# Patient Record
Sex: Female | Born: 1950 | Race: Asian | Hispanic: No | Marital: Married
Health system: Southern US, Community
[De-identification: ages and names within clinical notes are randomized; demographics above are authoritative.]

## PROBLEM LIST (undated history)

## (undated) DIAGNOSIS — E119 Type 2 diabetes mellitus without complications: Secondary | ICD-10-CM

## (undated) DIAGNOSIS — I10 Essential (primary) hypertension: Secondary | ICD-10-CM

## (undated) HISTORY — DX: Essential (primary) hypertension: I10

## (undated) HISTORY — DX: Type 2 diabetes mellitus without complications: E11.9

---

## 1998-11-15 HISTORY — PX: ABDOMINAL HYSTERECTOMY: SHX81

## 2007-06-16 ENCOUNTER — Other Ambulatory Visit: Admission: RE | Admit: 2007-06-16 | Discharge: 2007-06-16 | Payer: Self-pay | Admitting: Family Medicine

## 2008-07-09 ENCOUNTER — Ambulatory Visit: Payer: Self-pay | Admitting: Internal Medicine

## 2008-07-09 DIAGNOSIS — I10 Essential (primary) hypertension: Secondary | ICD-10-CM | POA: Insufficient documentation

## 2008-07-09 DIAGNOSIS — R1011 Right upper quadrant pain: Secondary | ICD-10-CM | POA: Insufficient documentation

## 2008-07-11 ENCOUNTER — Encounter: Admission: RE | Admit: 2008-07-11 | Discharge: 2008-07-11 | Payer: Self-pay | Admitting: Internal Medicine

## 2008-07-11 ENCOUNTER — Telehealth: Payer: Self-pay | Admitting: Internal Medicine

## 2010-04-14 ENCOUNTER — Other Ambulatory Visit: Admission: RE | Admit: 2010-04-14 | Discharge: 2010-04-14 | Payer: Self-pay | Admitting: Family Medicine

## 2010-10-12 ENCOUNTER — Encounter: Admission: RE | Admit: 2010-10-12 | Discharge: 2010-10-12 | Payer: Self-pay | Admitting: Family Medicine

## 2014-03-13 ENCOUNTER — Other Ambulatory Visit: Payer: Self-pay | Admitting: Family Medicine

## 2014-03-13 DIAGNOSIS — Z800 Family history of malignant neoplasm of digestive organs: Secondary | ICD-10-CM

## 2014-03-13 DIAGNOSIS — R1011 Right upper quadrant pain: Secondary | ICD-10-CM

## 2014-03-18 ENCOUNTER — Ambulatory Visit
Admission: RE | Admit: 2014-03-18 | Discharge: 2014-03-18 | Disposition: A | Discharge status: Home / Self Care | Payer: Managed Care, Other (non HMO) | Source: Ambulatory Visit | Attending: Family Medicine | Admitting: Family Medicine

## 2014-03-18 DIAGNOSIS — R1011 Right upper quadrant pain: Secondary | ICD-10-CM

## 2014-03-18 DIAGNOSIS — Z800 Family history of malignant neoplasm of digestive organs: Secondary | ICD-10-CM

## 2015-01-15 ENCOUNTER — Other Ambulatory Visit: Payer: Self-pay | Admitting: Gastroenterology

## 2015-01-15 DIAGNOSIS — R1310 Dysphagia, unspecified: Secondary | ICD-10-CM

## 2015-01-17 ENCOUNTER — Ambulatory Visit
Admission: RE | Admit: 2015-01-17 | Discharge: 2015-01-17 | Disposition: A | Discharge status: Home / Self Care | Payer: Managed Care, Other (non HMO) | Source: Ambulatory Visit | Attending: Gastroenterology | Admitting: Gastroenterology

## 2015-01-17 DIAGNOSIS — R1310 Dysphagia, unspecified: Secondary | ICD-10-CM

## 2015-04-01 ENCOUNTER — Other Ambulatory Visit (HOSPITAL_COMMUNITY): Payer: Self-pay | Admitting: Gastroenterology

## 2015-04-01 DIAGNOSIS — R109 Unspecified abdominal pain: Secondary | ICD-10-CM

## 2015-04-15 ENCOUNTER — Ambulatory Visit (HOSPITAL_COMMUNITY)
Admission: RE | Admit: 2015-04-15 | Discharge: 2015-04-15 | Disposition: A | Discharge status: Home / Self Care | Payer: Managed Care, Other (non HMO) | Source: Ambulatory Visit | Attending: Gastroenterology | Admitting: Gastroenterology

## 2015-04-15 DIAGNOSIS — R109 Unspecified abdominal pain: Secondary | ICD-10-CM

## 2015-05-08 ENCOUNTER — Ambulatory Visit (HOSPITAL_COMMUNITY)
Admission: RE | Admit: 2015-05-08 | Discharge: 2015-05-08 | Disposition: A | Discharge status: Home / Self Care | Payer: Managed Care, Other (non HMO) | Source: Ambulatory Visit | Attending: Gastroenterology | Admitting: Gastroenterology

## 2015-05-08 ENCOUNTER — Other Ambulatory Visit (HOSPITAL_COMMUNITY): Payer: Managed Care, Other (non HMO)

## 2015-05-08 ENCOUNTER — Encounter (HOSPITAL_COMMUNITY): Payer: Self-pay

## 2015-05-08 ENCOUNTER — Other Ambulatory Visit (HOSPITAL_COMMUNITY): Payer: Self-pay | Admitting: Gastroenterology

## 2015-05-08 DIAGNOSIS — R1011 Right upper quadrant pain: Secondary | ICD-10-CM | POA: Insufficient documentation

## 2015-05-08 DIAGNOSIS — R109 Unspecified abdominal pain: Secondary | ICD-10-CM

## 2015-05-08 MED ORDER — TECHNETIUM TC 99M SULFUR COLLOID
2.1000 | Freq: Once | INTRAVENOUS | Status: AC | PRN
  Administered 2015-05-08: 2.1 via INTRAVENOUS

## 2015-05-08 MED ORDER — TECHNETIUM TC 99M SULFUR COLLOID: 2.1000 | Freq: Once | INTRAVENOUS | Status: AC | PRN

## 2015-12-15 ENCOUNTER — Other Ambulatory Visit: Payer: Self-pay | Admitting: Family Medicine

## 2015-12-15 DIAGNOSIS — R1011 Right upper quadrant pain: Secondary | ICD-10-CM

## 2015-12-23 ENCOUNTER — Ambulatory Visit
Admission: RE | Admit: 2015-12-23 | Discharge: 2015-12-23 | Disposition: A | Discharge status: Home / Self Care | Payer: Managed Care, Other (non HMO) | Source: Ambulatory Visit | Attending: Family Medicine | Admitting: Family Medicine

## 2015-12-23 DIAGNOSIS — R1011 Right upper quadrant pain: Secondary | ICD-10-CM

## 2018-01-12 ENCOUNTER — Other Ambulatory Visit: Payer: Self-pay | Admitting: Gastroenterology

## 2018-01-12 DIAGNOSIS — R1084 Generalized abdominal pain: Secondary | ICD-10-CM

## 2018-01-23 ENCOUNTER — Ambulatory Visit
Admission: RE | Admit: 2018-01-23 | Discharge: 2018-01-23 | Disposition: A | Discharge status: Home / Self Care | Payer: Commercial Managed Care - PPO | Source: Ambulatory Visit | Attending: Gastroenterology | Admitting: Gastroenterology

## 2018-01-23 DIAGNOSIS — R1084 Generalized abdominal pain: Secondary | ICD-10-CM

## 2018-09-26 ENCOUNTER — Other Ambulatory Visit: Payer: Self-pay | Admitting: Gastroenterology

## 2018-09-26 DIAGNOSIS — R1011 Right upper quadrant pain: Secondary | ICD-10-CM

## 2018-09-27 ENCOUNTER — Ambulatory Visit
Admission: RE | Admit: 2018-09-27 | Discharge: 2018-09-27 | Disposition: A | Discharge status: Home / Self Care | Payer: Medicare Other | Source: Ambulatory Visit | Attending: Gastroenterology | Admitting: Gastroenterology

## 2018-09-27 DIAGNOSIS — R1011 Right upper quadrant pain: Secondary | ICD-10-CM

## 2019-07-06 IMAGING — US US ABDOMEN COMPLETE
1 series · 13 of 25 positions shown · non-contrast
Comparison: 01/23/2018

CLINICAL DATA: Right upper quadrant pain

EXAM:
ABDOMEN ULTRASOUND COMPLETE

[Series 1: us abdomen complete · 0.15mm/px · 13 of 84 slices shown]
[im 1/84]
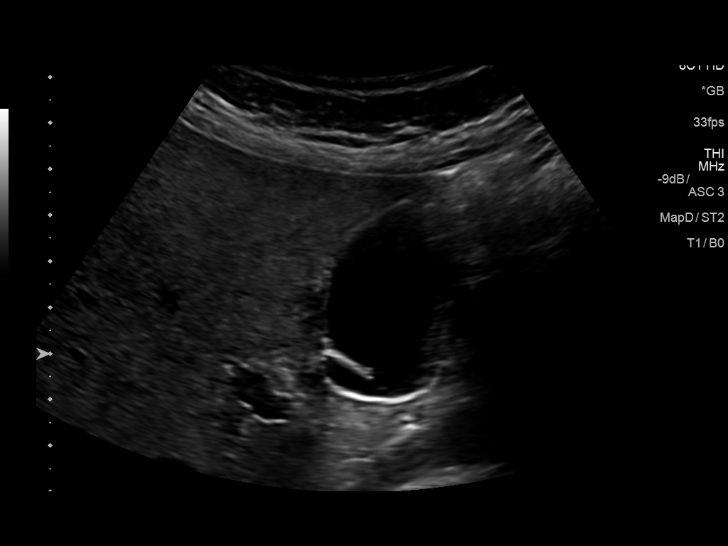
[im 7/84]
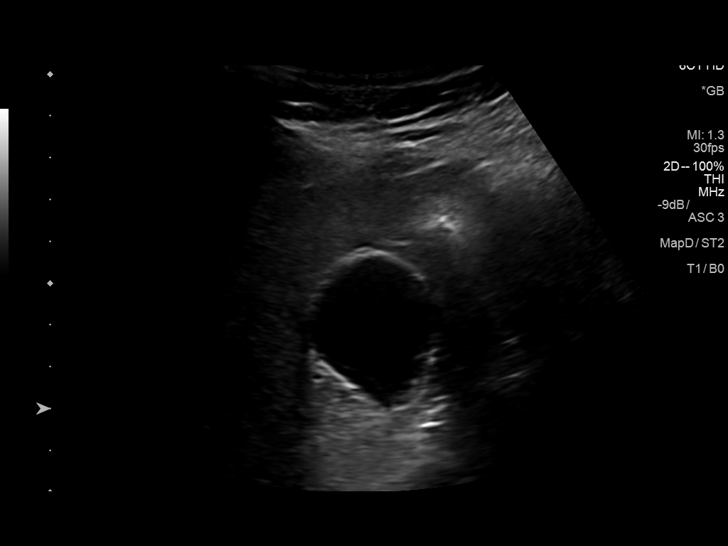
[im 14/84]
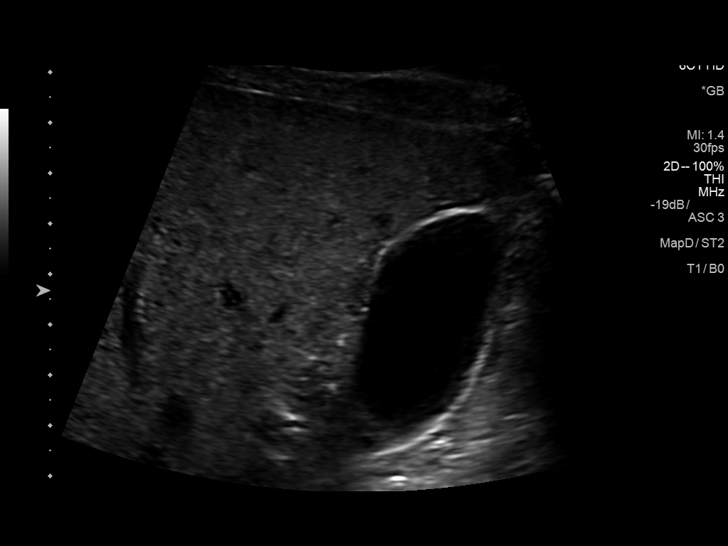
[im 21/84]
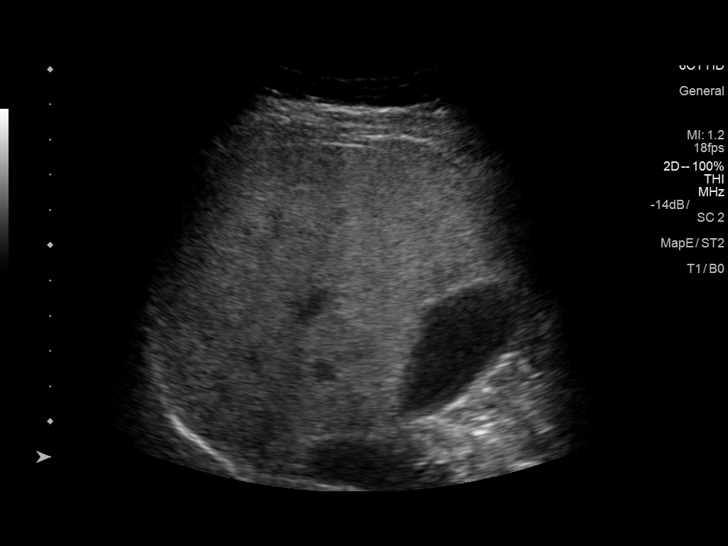
[im 28/84]
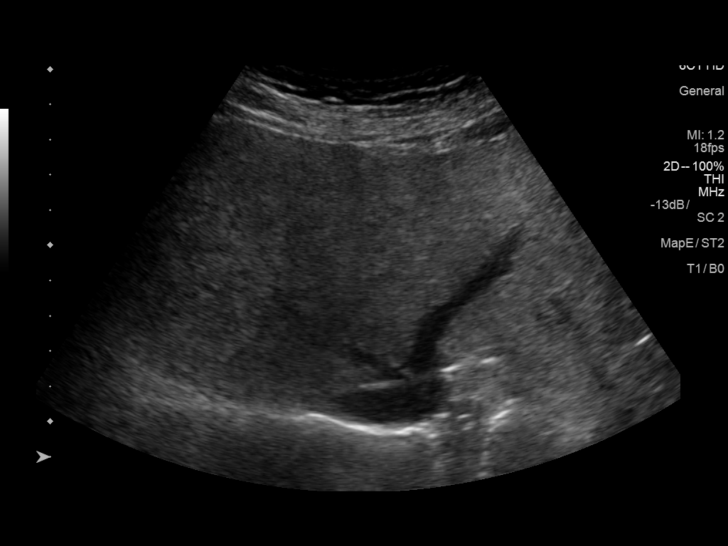
[im 35/84]
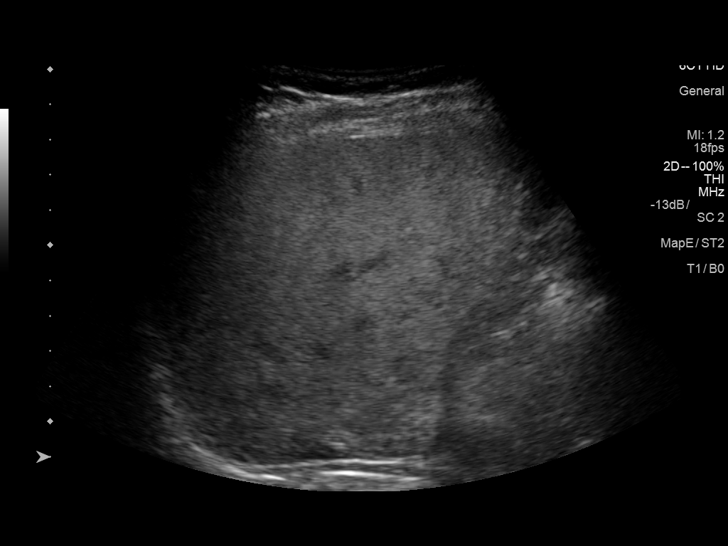
[im 42/84]
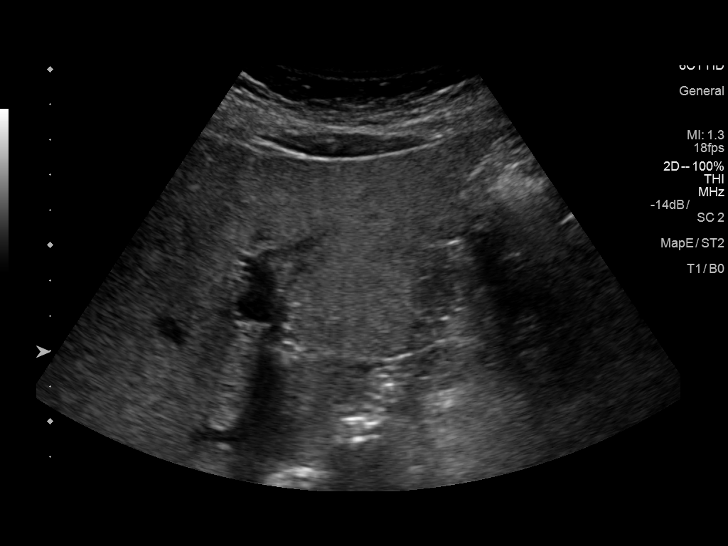
[im 49/84]
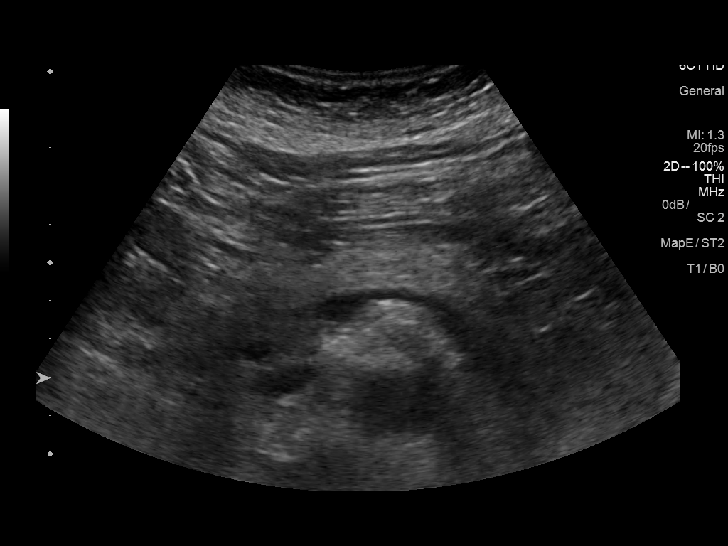
[im 56/84]
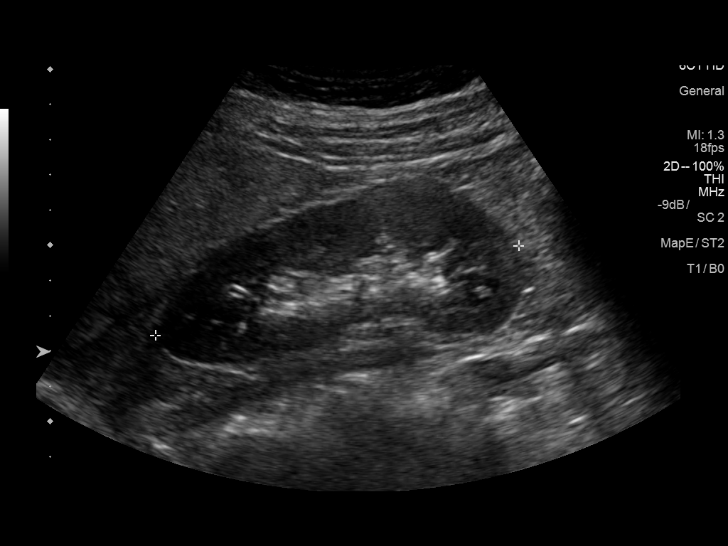
[im 63/84]
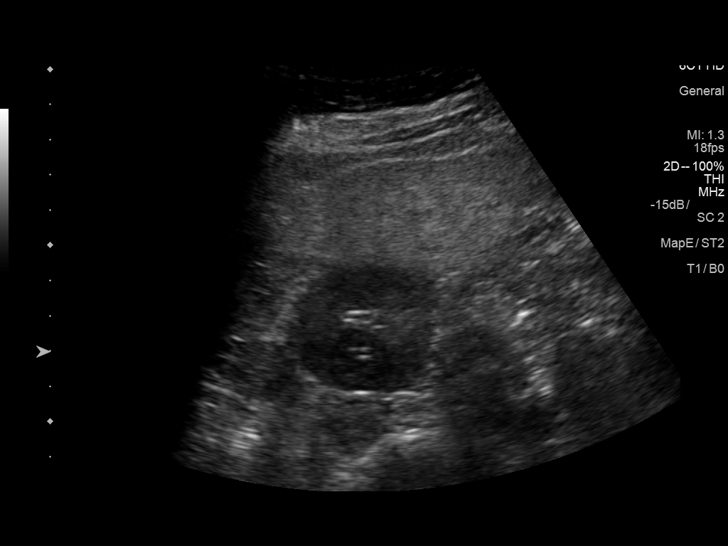
[im 70/84]
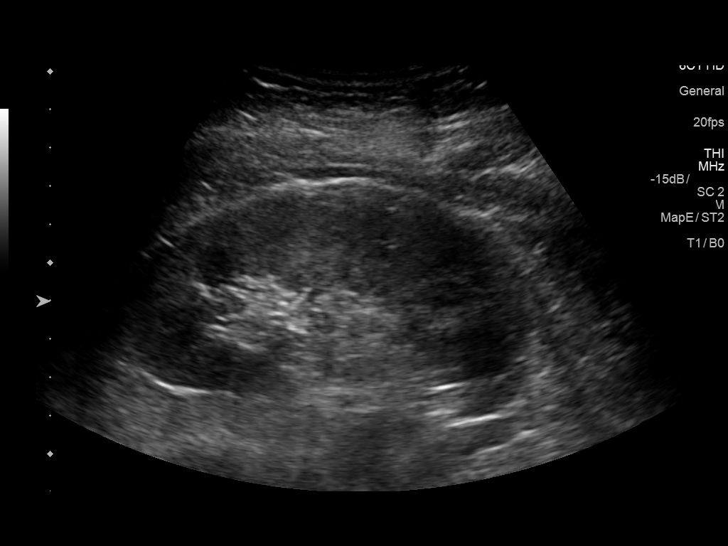
[im 77/84]
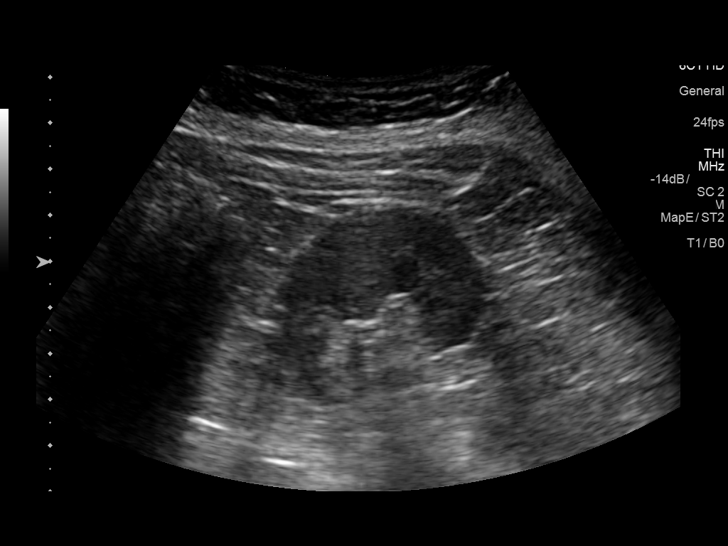
[im 84/84]
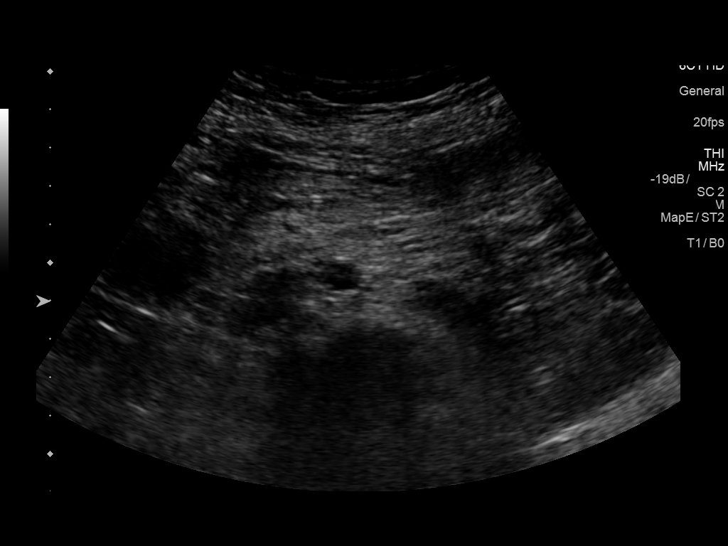

[13 of 25 positions shown; findings below may reference images not displayed]

FINDINGS: Gallbladder: No gallstones or wall thickening visualized. No
sonographic Murphy sign noted by sonographer.

Common bile duct: Diameter: 3.2 mm.

Liver: Mild heterogeneity is noted similar to that seen on the prior
exam. This again suggests fatty infiltration. No focal mass is
noted. Portal vein is patent on color Doppler imaging with normal
direction of blood flow towards the liver.

IVC: No abnormality visualized.

Pancreas: Visualized portion unremarkable.

Spleen: Size and appearance within normal limits.

Right Kidney: Length: 10.6 cm.. Echogenicity within normal limits.
No mass or hydronephrosis visualized.

Left Kidney: Length: 10.3 cm.. No hydronephrosis is noted. A 1.2 cm
echogenic focus is noted within the renal parenchyma most consistent
with an angiomyolipoma. This was not well visualized on the prior
exam.

Abdominal aorta: No aneurysm visualized.

Other findings: None.
IMPRESSION: Stable heterogeneity of the liver likely related to fatty
infiltration.

Small echogenic focus within the substance of the left kidney which
may represent a small angiomyolipoma.

No new focal abnormality is seen.

## 2019-08-02 DIAGNOSIS — Z800 Family history of malignant neoplasm of digestive organs: Secondary | ICD-10-CM | POA: Diagnosis not present

## 2019-08-02 DIAGNOSIS — Z86010 Personal history of colonic polyps: Secondary | ICD-10-CM | POA: Diagnosis not present

## 2019-08-02 DIAGNOSIS — Z780 Asymptomatic menopausal state: Secondary | ICD-10-CM | POA: Diagnosis not present

## 2019-08-02 DIAGNOSIS — Z0000 Encounter for general adult medical examination without abnormal findings: Secondary | ICD-10-CM | POA: Diagnosis not present

## 2019-08-02 DIAGNOSIS — E119 Type 2 diabetes mellitus without complications: Secondary | ICD-10-CM | POA: Diagnosis not present

## 2019-08-02 DIAGNOSIS — I10 Essential (primary) hypertension: Secondary | ICD-10-CM | POA: Diagnosis not present

## 2019-08-02 DIAGNOSIS — Q524 Other congenital malformations of vagina: Secondary | ICD-10-CM | POA: Diagnosis not present

## 2019-08-02 DIAGNOSIS — E559 Vitamin D deficiency, unspecified: Secondary | ICD-10-CM | POA: Diagnosis not present

## 2019-08-02 DIAGNOSIS — Z13220 Encounter for screening for lipoid disorders: Secondary | ICD-10-CM | POA: Diagnosis not present

## 2019-08-21 DIAGNOSIS — Z13220 Encounter for screening for lipoid disorders: Secondary | ICD-10-CM | POA: Diagnosis not present

## 2019-08-21 DIAGNOSIS — Z0000 Encounter for general adult medical examination without abnormal findings: Secondary | ICD-10-CM | POA: Diagnosis not present

## 2019-08-21 DIAGNOSIS — I10 Essential (primary) hypertension: Secondary | ICD-10-CM | POA: Diagnosis not present

## 2019-08-21 DIAGNOSIS — Z23 Encounter for immunization: Secondary | ICD-10-CM | POA: Diagnosis not present

## 2019-08-21 DIAGNOSIS — Q524 Other congenital malformations of vagina: Secondary | ICD-10-CM | POA: Diagnosis not present

## 2019-08-21 DIAGNOSIS — E119 Type 2 diabetes mellitus without complications: Secondary | ICD-10-CM | POA: Diagnosis not present

## 2019-08-21 DIAGNOSIS — Z800 Family history of malignant neoplasm of digestive organs: Secondary | ICD-10-CM | POA: Diagnosis not present

## 2019-08-21 DIAGNOSIS — E559 Vitamin D deficiency, unspecified: Secondary | ICD-10-CM | POA: Diagnosis not present

## 2019-08-21 DIAGNOSIS — Z86010 Personal history of colonic polyps: Secondary | ICD-10-CM | POA: Diagnosis not present

## 2019-08-22 DIAGNOSIS — Z1231 Encounter for screening mammogram for malignant neoplasm of breast: Secondary | ICD-10-CM | POA: Diagnosis not present

## 2019-08-22 DIAGNOSIS — Z90710 Acquired absence of both cervix and uterus: Secondary | ICD-10-CM | POA: Diagnosis not present

## 2019-08-22 DIAGNOSIS — Z780 Asymptomatic menopausal state: Secondary | ICD-10-CM | POA: Diagnosis not present

## 2019-08-22 DIAGNOSIS — E2839 Other primary ovarian failure: Secondary | ICD-10-CM | POA: Diagnosis not present

## 2019-09-27 DIAGNOSIS — Z1159 Encounter for screening for other viral diseases: Secondary | ICD-10-CM | POA: Diagnosis not present

## 2019-10-02 DIAGNOSIS — Z86010 Personal history of colonic polyps: Secondary | ICD-10-CM | POA: Diagnosis not present

## 2020-01-10 ENCOUNTER — Ambulatory Visit: Payer: Medicare HMO | Attending: Internal Medicine

## 2020-01-10 DIAGNOSIS — Z23 Encounter for immunization: Secondary | ICD-10-CM

## 2020-01-10 NOTE — Progress Notes (Signed)
   Covid-19 Vaccination Clinic  Name:  Megan Pierce    MRN: [id number] DOB: 1951/04/16  01/10/2020  Megan Pierce was observed post Covid-19 immunization for 15 minutes without incidence. She was provided with Vaccine Information Sheet and instruction to access the V-Safe system.   Megan Pierce was instructed to call 911 with any severe reactions post vaccine: Marland Kitchen Difficulty breathing  . Swelling of your face and throat  . A fast heartbeat  . A bad rash all over your body  . Dizziness and weakness    Immunizations Administered    Name Date Dose VIS Date Route   Pfizer COVID-19 Vaccine 01/10/2020  1:12 PM 0.3 mL 10/26/2019 Intramuscular   Manufacturer: [place]   Lot: [id number]   [place]: [id number]

## 2020-02-05 ENCOUNTER — Ambulatory Visit: Payer: Medicare HMO | Attending: Internal Medicine

## 2020-02-05 DIAGNOSIS — Z23 Encounter for immunization: Secondary | ICD-10-CM

## 2020-02-05 NOTE — Progress Notes (Signed)
   Covid-19 Vaccination Clinic  Name:  Juwanda Leggio    MRN: [id number] DOB: 18-Sep-1951  02/05/2020  Ms. Barsky was observed post Covid-19 immunization for 15 minutes without incident. She was provided with Vaccine Information Sheet and instruction to access the V-Safe system.   Ms. Wintjen was instructed to call 911 with any severe reactions post vaccine: Marland Kitchen Difficulty breathing  . Swelling of face and throat  . A fast heartbeat  . A bad rash all over body  . Dizziness and weakness   Immunizations Administered    Name Date Dose VIS Date Route   Pfizer COVID-19 Vaccine 02/05/2020 10:03 AM 0.3 mL 10/26/2019 Intramuscular   Manufacturer: [place]   Lot: [id number]   [place]: [id number]

## 2020-08-25 DIAGNOSIS — Z1231 Encounter for screening mammogram for malignant neoplasm of breast: Secondary | ICD-10-CM | POA: Diagnosis not present

## 2020-10-14 DIAGNOSIS — E119 Type 2 diabetes mellitus without complications: Secondary | ICD-10-CM | POA: Diagnosis not present

## 2020-10-14 DIAGNOSIS — I10 Essential (primary) hypertension: Secondary | ICD-10-CM | POA: Diagnosis not present

## 2020-10-14 DIAGNOSIS — E559 Vitamin D deficiency, unspecified: Secondary | ICD-10-CM | POA: Diagnosis not present

## 2020-10-23 DIAGNOSIS — E119 Type 2 diabetes mellitus without complications: Secondary | ICD-10-CM | POA: Diagnosis not present

## 2020-10-23 DIAGNOSIS — I10 Essential (primary) hypertension: Secondary | ICD-10-CM | POA: Diagnosis not present

## 2020-10-23 DIAGNOSIS — E559 Vitamin D deficiency, unspecified: Secondary | ICD-10-CM | POA: Diagnosis not present

## 2020-11-27 DIAGNOSIS — Z228 Carrier of other infectious diseases: Secondary | ICD-10-CM | POA: Diagnosis not present

## 2020-11-27 DIAGNOSIS — E559 Vitamin D deficiency, unspecified: Secondary | ICD-10-CM | POA: Diagnosis not present

## 2020-11-27 DIAGNOSIS — R69 Illness, unspecified: Secondary | ICD-10-CM | POA: Diagnosis not present

## 2020-11-27 DIAGNOSIS — E1169 Type 2 diabetes mellitus with other specified complication: Secondary | ICD-10-CM | POA: Diagnosis not present

## 2020-11-27 DIAGNOSIS — Z789 Other specified health status: Secondary | ICD-10-CM | POA: Diagnosis not present

## 2020-11-27 DIAGNOSIS — K760 Fatty (change of) liver, not elsewhere classified: Secondary | ICD-10-CM | POA: Diagnosis not present

## 2020-11-27 DIAGNOSIS — Z1159 Encounter for screening for other viral diseases: Secondary | ICD-10-CM | POA: Diagnosis not present

## 2020-11-27 DIAGNOSIS — Z86010 Personal history of colonic polyps: Secondary | ICD-10-CM | POA: Diagnosis not present

## 2020-11-27 DIAGNOSIS — Z800 Family history of malignant neoplasm of digestive organs: Secondary | ICD-10-CM | POA: Diagnosis not present

## 2020-11-27 DIAGNOSIS — I10 Essential (primary) hypertension: Secondary | ICD-10-CM | POA: Diagnosis not present

## 2021-01-05 DIAGNOSIS — H35372 Puckering of macula, left eye: Secondary | ICD-10-CM | POA: Diagnosis not present

## 2021-01-05 DIAGNOSIS — H25813 Combined forms of age-related cataract, bilateral: Secondary | ICD-10-CM | POA: Diagnosis not present

## 2021-03-03 DIAGNOSIS — E559 Vitamin D deficiency, unspecified: Secondary | ICD-10-CM | POA: Diagnosis not present

## 2021-03-03 DIAGNOSIS — Z789 Other specified health status: Secondary | ICD-10-CM | POA: Diagnosis not present

## 2021-03-03 DIAGNOSIS — I10 Essential (primary) hypertension: Secondary | ICD-10-CM | POA: Diagnosis not present

## 2021-03-03 DIAGNOSIS — E1169 Type 2 diabetes mellitus with other specified complication: Secondary | ICD-10-CM | POA: Diagnosis not present

## 2021-03-03 DIAGNOSIS — K760 Fatty (change of) liver, not elsewhere classified: Secondary | ICD-10-CM | POA: Diagnosis not present

## 2021-03-03 DIAGNOSIS — Z800 Family history of malignant neoplasm of digestive organs: Secondary | ICD-10-CM | POA: Diagnosis not present

## 2021-03-03 DIAGNOSIS — Z86010 Personal history of colonic polyps: Secondary | ICD-10-CM | POA: Diagnosis not present

## 2021-03-06 DIAGNOSIS — I10 Essential (primary) hypertension: Secondary | ICD-10-CM | POA: Diagnosis not present

## 2021-03-06 DIAGNOSIS — E559 Vitamin D deficiency, unspecified: Secondary | ICD-10-CM | POA: Diagnosis not present

## 2021-03-06 DIAGNOSIS — Z86010 Personal history of colonic polyps: Secondary | ICD-10-CM | POA: Diagnosis not present

## 2021-03-06 DIAGNOSIS — Z800 Family history of malignant neoplasm of digestive organs: Secondary | ICD-10-CM | POA: Diagnosis not present

## 2021-03-06 DIAGNOSIS — E1169 Type 2 diabetes mellitus with other specified complication: Secondary | ICD-10-CM | POA: Diagnosis not present

## 2021-03-06 DIAGNOSIS — K760 Fatty (change of) liver, not elsewhere classified: Secondary | ICD-10-CM | POA: Diagnosis not present

## 2021-03-06 DIAGNOSIS — Z789 Other specified health status: Secondary | ICD-10-CM | POA: Diagnosis not present

## 2021-03-31 DIAGNOSIS — K760 Fatty (change of) liver, not elsewhere classified: Secondary | ICD-10-CM | POA: Diagnosis not present

## 2021-03-31 DIAGNOSIS — Z800 Family history of malignant neoplasm of digestive organs: Secondary | ICD-10-CM | POA: Diagnosis not present

## 2021-03-31 DIAGNOSIS — R1011 Right upper quadrant pain: Secondary | ICD-10-CM | POA: Diagnosis not present

## 2021-04-02 ENCOUNTER — Other Ambulatory Visit: Payer: Self-pay | Admitting: Gastroenterology

## 2021-04-02 DIAGNOSIS — K760 Fatty (change of) liver, not elsewhere classified: Secondary | ICD-10-CM

## 2021-04-02 DIAGNOSIS — R1011 Right upper quadrant pain: Secondary | ICD-10-CM

## 2021-04-20 DIAGNOSIS — R899 Unspecified abnormal finding in specimens from other organs, systems and tissues: Secondary | ICD-10-CM | POA: Diagnosis not present

## 2021-05-04 ENCOUNTER — Other Ambulatory Visit: Payer: Medicare HMO

## 2021-06-12 DIAGNOSIS — R1011 Right upper quadrant pain: Secondary | ICD-10-CM | POA: Diagnosis not present

## 2021-06-12 DIAGNOSIS — Z800 Family history of malignant neoplasm of digestive organs: Secondary | ICD-10-CM | POA: Diagnosis not present

## 2021-06-12 DIAGNOSIS — K760 Fatty (change of) liver, not elsewhere classified: Secondary | ICD-10-CM | POA: Diagnosis not present

## 2021-06-12 DIAGNOSIS — E1169 Type 2 diabetes mellitus with other specified complication: Secondary | ICD-10-CM | POA: Diagnosis not present

## 2021-06-12 DIAGNOSIS — Z86010 Personal history of colonic polyps: Secondary | ICD-10-CM | POA: Diagnosis not present

## 2021-06-12 DIAGNOSIS — I10 Essential (primary) hypertension: Secondary | ICD-10-CM | POA: Diagnosis not present

## 2021-06-12 DIAGNOSIS — E559 Vitamin D deficiency, unspecified: Secondary | ICD-10-CM | POA: Diagnosis not present

## 2021-06-12 DIAGNOSIS — Z789 Other specified health status: Secondary | ICD-10-CM | POA: Diagnosis not present

## 2021-06-18 DIAGNOSIS — Z0000 Encounter for general adult medical examination without abnormal findings: Secondary | ICD-10-CM | POA: Diagnosis not present

## 2021-06-22 ENCOUNTER — Ambulatory Visit
Admission: RE | Admit: 2021-06-22 | Discharge: 2021-06-22 | Disposition: A | Discharge status: Home / Self Care | Payer: Medicare HMO | Source: Ambulatory Visit | Attending: Gastroenterology | Admitting: Gastroenterology

## 2021-06-22 DIAGNOSIS — K760 Fatty (change of) liver, not elsewhere classified: Secondary | ICD-10-CM | POA: Diagnosis not present

## 2021-06-22 DIAGNOSIS — R1011 Right upper quadrant pain: Secondary | ICD-10-CM

## 2021-06-29 DIAGNOSIS — L918 Other hypertrophic disorders of the skin: Secondary | ICD-10-CM | POA: Diagnosis not present

## 2021-06-29 DIAGNOSIS — D1801 Hemangioma of skin and subcutaneous tissue: Secondary | ICD-10-CM | POA: Diagnosis not present

## 2021-06-29 DIAGNOSIS — L249 Irritant contact dermatitis, unspecified cause: Secondary | ICD-10-CM | POA: Diagnosis not present

## 2021-06-29 DIAGNOSIS — L218 Other seborrheic dermatitis: Secondary | ICD-10-CM | POA: Diagnosis not present

## 2021-10-23 DIAGNOSIS — R42 Dizziness and giddiness: Secondary | ICD-10-CM | POA: Diagnosis not present

## 2021-10-23 DIAGNOSIS — E559 Vitamin D deficiency, unspecified: Secondary | ICD-10-CM | POA: Diagnosis not present

## 2021-10-23 DIAGNOSIS — I10 Essential (primary) hypertension: Secondary | ICD-10-CM | POA: Diagnosis not present

## 2021-10-23 DIAGNOSIS — E1169 Type 2 diabetes mellitus with other specified complication: Secondary | ICD-10-CM | POA: Diagnosis not present

## 2021-11-26 DIAGNOSIS — R42 Dizziness and giddiness: Secondary | ICD-10-CM | POA: Diagnosis not present

## 2021-11-26 DIAGNOSIS — E119 Type 2 diabetes mellitus without complications: Secondary | ICD-10-CM | POA: Diagnosis not present

## 2021-12-01 DIAGNOSIS — M722 Plantar fascial fibromatosis: Secondary | ICD-10-CM | POA: Diagnosis not present

## 2021-12-01 DIAGNOSIS — Z800 Family history of malignant neoplasm of digestive organs: Secondary | ICD-10-CM | POA: Diagnosis not present

## 2021-12-01 DIAGNOSIS — I10 Essential (primary) hypertension: Secondary | ICD-10-CM | POA: Diagnosis not present

## 2021-12-01 DIAGNOSIS — R69 Illness, unspecified: Secondary | ICD-10-CM | POA: Diagnosis not present

## 2021-12-01 DIAGNOSIS — K760 Fatty (change of) liver, not elsewhere classified: Secondary | ICD-10-CM | POA: Diagnosis not present

## 2021-12-01 DIAGNOSIS — E559 Vitamin D deficiency, unspecified: Secondary | ICD-10-CM | POA: Diagnosis not present

## 2021-12-01 DIAGNOSIS — Z205 Contact with and (suspected) exposure to viral hepatitis: Secondary | ICD-10-CM | POA: Diagnosis not present

## 2021-12-01 DIAGNOSIS — E1169 Type 2 diabetes mellitus with other specified complication: Secondary | ICD-10-CM | POA: Diagnosis not present

## 2021-12-01 DIAGNOSIS — Z789 Other specified health status: Secondary | ICD-10-CM | POA: Diagnosis not present

## 2022-01-11 DIAGNOSIS — H25813 Combined forms of age-related cataract, bilateral: Secondary | ICD-10-CM | POA: Diagnosis not present

## 2022-01-11 DIAGNOSIS — H35373 Puckering of macula, bilateral: Secondary | ICD-10-CM | POA: Diagnosis not present

## 2022-05-27 DIAGNOSIS — E559 Vitamin D deficiency, unspecified: Secondary | ICD-10-CM | POA: Diagnosis not present

## 2022-05-27 DIAGNOSIS — E1169 Type 2 diabetes mellitus with other specified complication: Secondary | ICD-10-CM | POA: Diagnosis not present

## 2022-06-17 DIAGNOSIS — R69 Illness, unspecified: Secondary | ICD-10-CM | POA: Diagnosis not present

## 2022-06-17 DIAGNOSIS — K760 Fatty (change of) liver, not elsewhere classified: Secondary | ICD-10-CM | POA: Diagnosis not present

## 2022-06-17 DIAGNOSIS — Z800 Family history of malignant neoplasm of digestive organs: Secondary | ICD-10-CM | POA: Diagnosis not present

## 2022-06-17 DIAGNOSIS — E559 Vitamin D deficiency, unspecified: Secondary | ICD-10-CM | POA: Diagnosis not present

## 2022-06-17 DIAGNOSIS — L209 Atopic dermatitis, unspecified: Secondary | ICD-10-CM | POA: Diagnosis not present

## 2022-06-17 DIAGNOSIS — E1169 Type 2 diabetes mellitus with other specified complication: Secondary | ICD-10-CM | POA: Diagnosis not present

## 2022-06-17 DIAGNOSIS — Z789 Other specified health status: Secondary | ICD-10-CM | POA: Diagnosis not present

## 2022-06-17 DIAGNOSIS — I10 Essential (primary) hypertension: Secondary | ICD-10-CM | POA: Diagnosis not present

## 2022-08-26 DIAGNOSIS — Z936 Other artificial openings of urinary tract status: Secondary | ICD-10-CM | POA: Diagnosis not present

## 2022-09-02 IMAGING — US US ABDOMEN COMPLETE
1 series · 14 of 25 positions shown · non-contrast
Comparison: 09/27/2018

CLINICAL DATA: Right upper quadrant discomfort, fatty liver

EXAM:
ABDOMEN ULTRASOUND COMPLETE

[Series 1: us abdomen complete · 0.25mm/px · 14 of 82 slices shown]
[im 1/82]
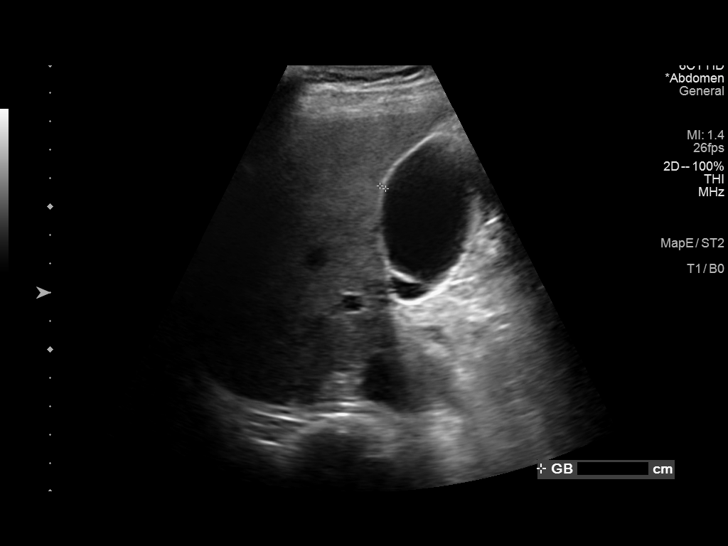
[im 7/82]
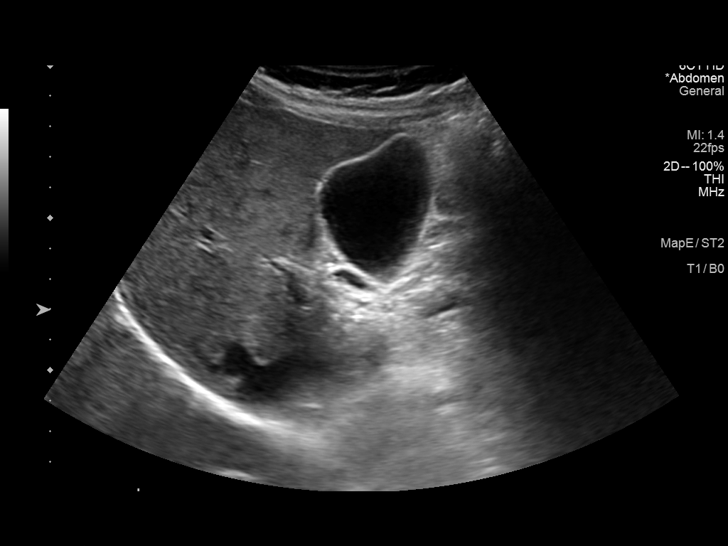
[im 14/82]
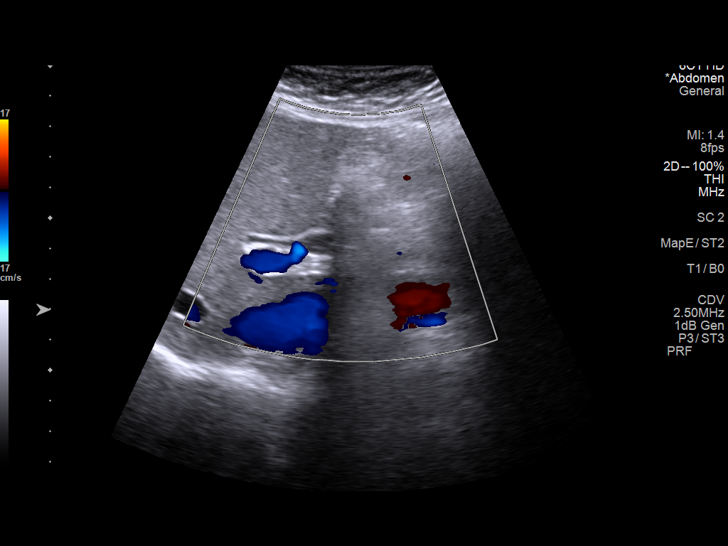
[im 21/82]
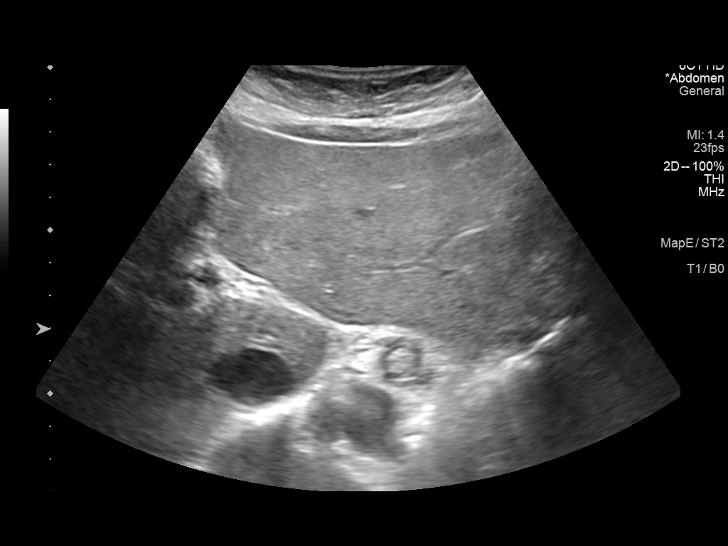
[im 28/82]
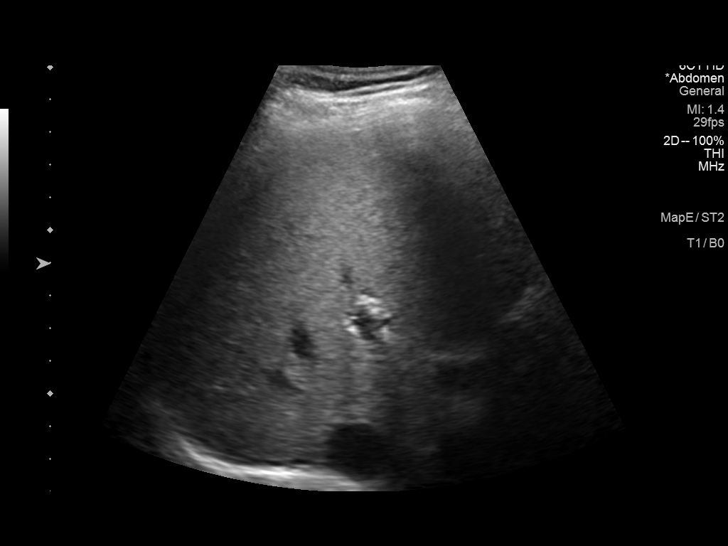
[im 31/82]
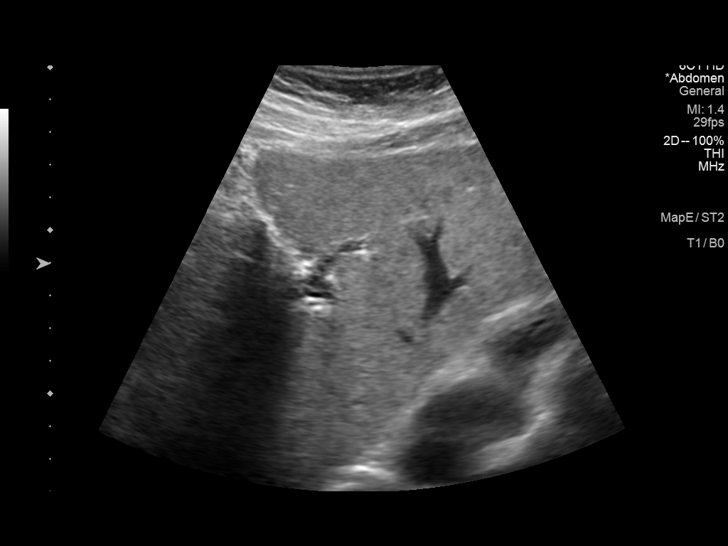
[im 38/82]
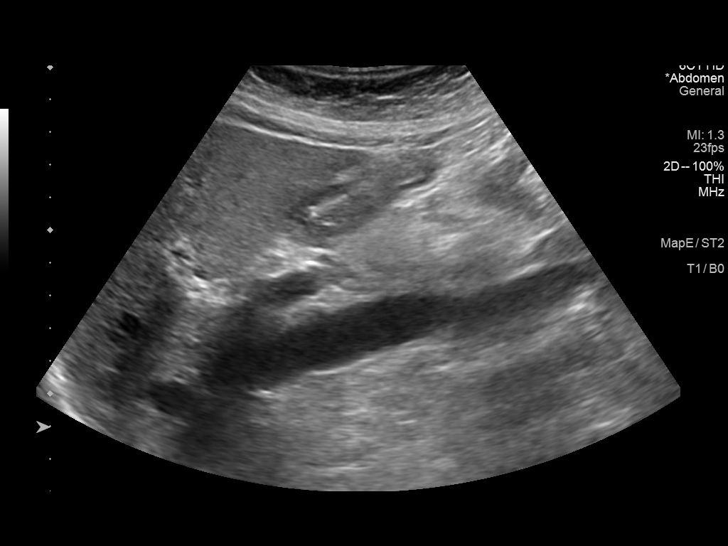
[im 44/82]
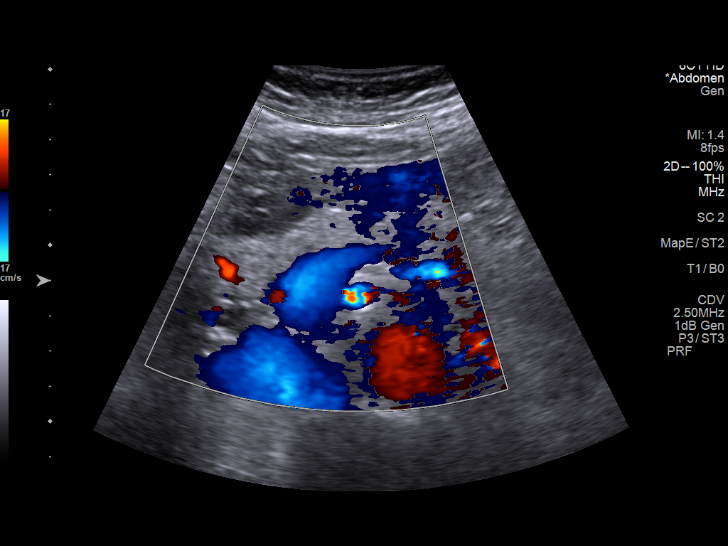
[im 51/82]
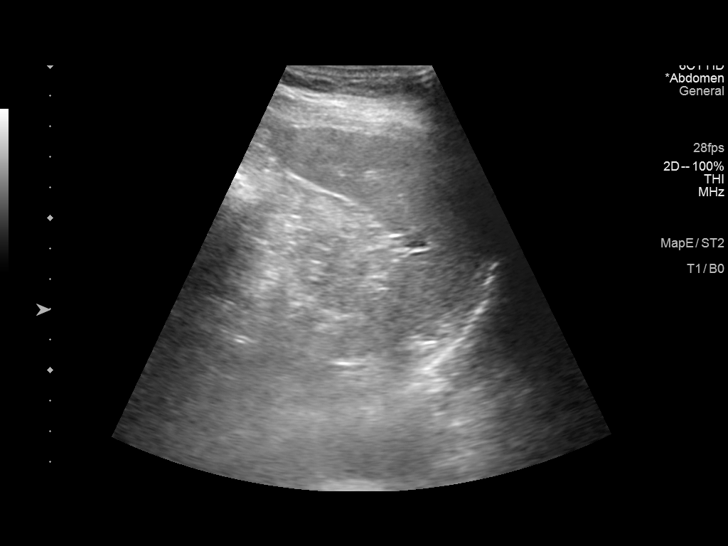
[im 55/82]
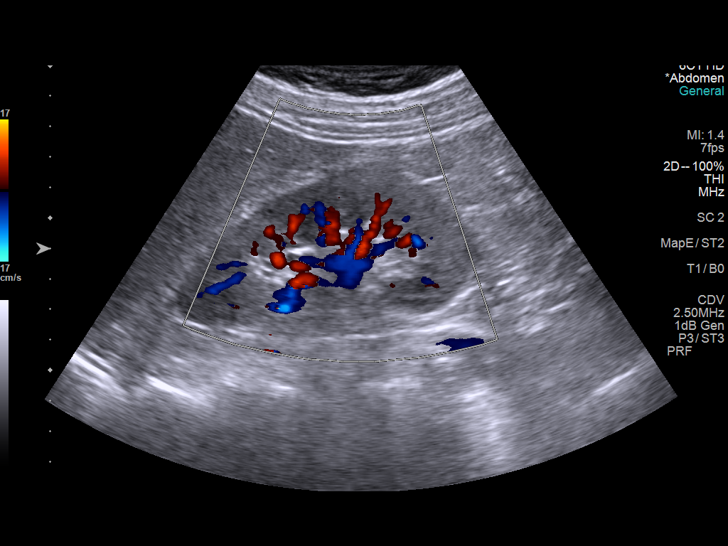
[im 61/82]
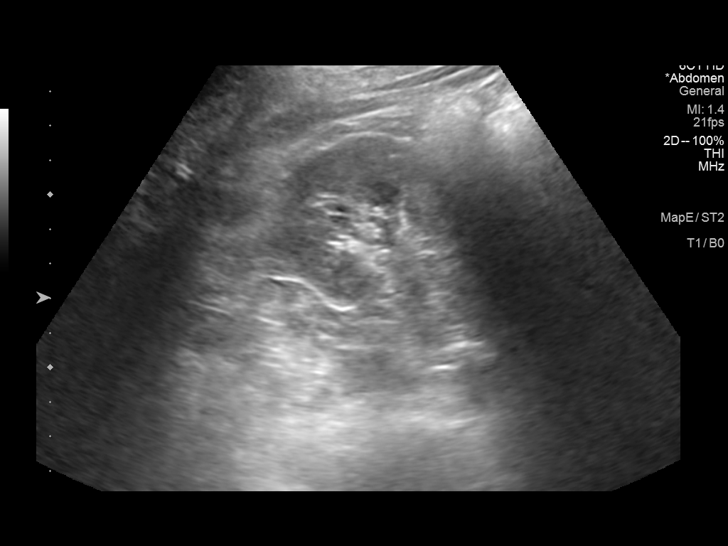
[im 68/82]
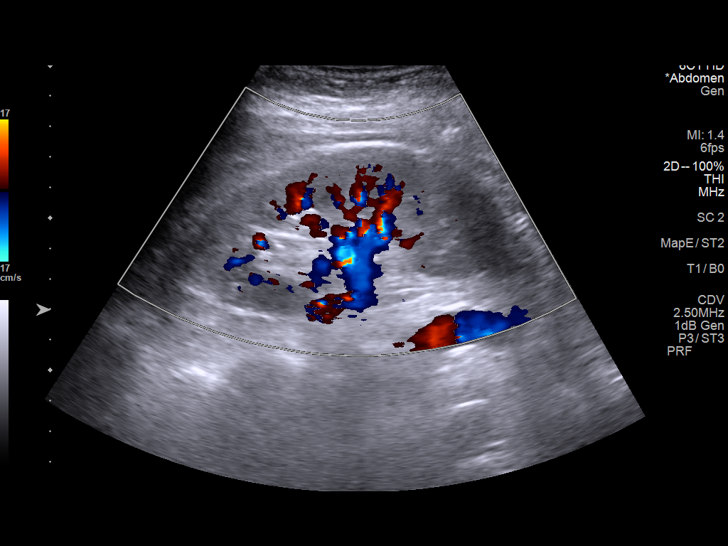
[im 75/82]
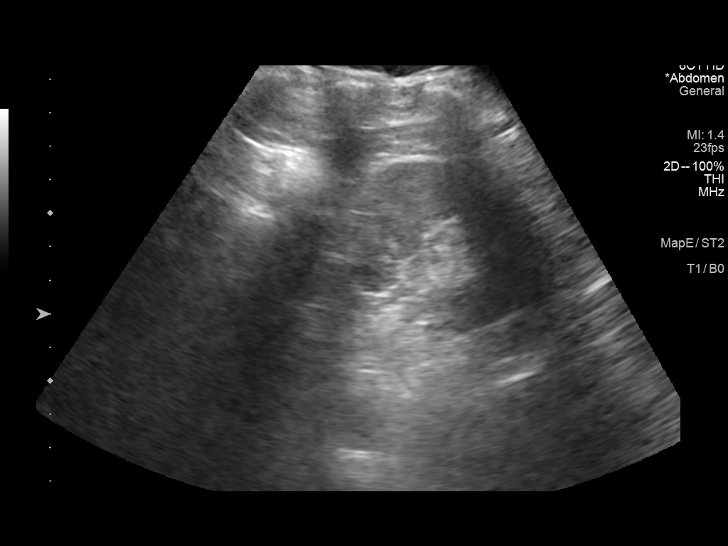
[im 82/82]
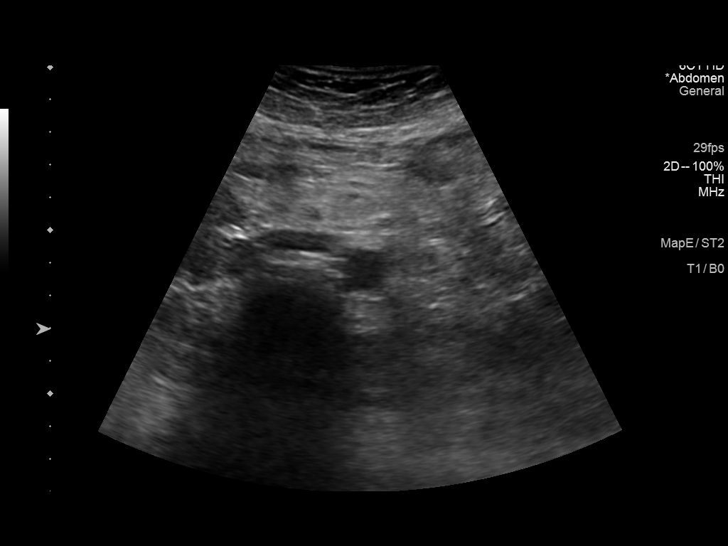

[14 of 25 positions shown; findings below may reference images not displayed]

FINDINGS: Gallbladder: No gallstones or wall thickening visualized. No
sonographic Murphy sign noted by sonographer.

Common bile duct: Diameter: 4 mm

Liver: Mild diffuse increased liver echotexture consistent with
given history of hepatic steatosis. No focal abnormality or
intrahepatic duct dilation. Portal vein is patent on color Doppler
imaging with normal direction of blood flow towards the liver.

IVC: No abnormality visualized.

Pancreas: Visualized portion unremarkable.

Spleen: Size and appearance within normal limits.

Right Kidney: Length: 10.6 cm. Echogenicity within normal limits. No
mass or hydronephrosis visualized.

Left Kidney: Length: 10.9 cm. Echogenicity within normal limits. No
mass or hydronephrosis visualized.

Abdominal aorta: No aneurysm visualized.

Other findings: None.
IMPRESSION: 1. Hepatic steatosis.
2. Otherwise unremarkable abdominal ultrasound.

## 2022-11-01 DIAGNOSIS — Z800 Family history of malignant neoplasm of digestive organs: Secondary | ICD-10-CM | POA: Diagnosis not present

## 2022-11-01 DIAGNOSIS — K760 Fatty (change of) liver, not elsewhere classified: Secondary | ICD-10-CM | POA: Diagnosis not present

## 2022-12-13 DIAGNOSIS — Z6824 Body mass index (BMI) 24.0-24.9, adult: Secondary | ICD-10-CM | POA: Diagnosis not present

## 2022-12-13 DIAGNOSIS — E1169 Type 2 diabetes mellitus with other specified complication: Secondary | ICD-10-CM | POA: Diagnosis not present

## 2022-12-13 DIAGNOSIS — K760 Fatty (change of) liver, not elsewhere classified: Secondary | ICD-10-CM | POA: Diagnosis not present

## 2022-12-13 DIAGNOSIS — E559 Vitamin D deficiency, unspecified: Secondary | ICD-10-CM | POA: Diagnosis not present

## 2022-12-13 DIAGNOSIS — Z789 Other specified health status: Secondary | ICD-10-CM | POA: Diagnosis not present

## 2022-12-13 DIAGNOSIS — I10 Essential (primary) hypertension: Secondary | ICD-10-CM | POA: Diagnosis not present

## 2022-12-22 ENCOUNTER — Other Ambulatory Visit: Payer: Self-pay | Admitting: Gastroenterology

## 2022-12-22 DIAGNOSIS — I10 Essential (primary) hypertension: Secondary | ICD-10-CM | POA: Diagnosis not present

## 2022-12-22 DIAGNOSIS — Z800 Family history of malignant neoplasm of digestive organs: Secondary | ICD-10-CM

## 2022-12-22 DIAGNOSIS — E1169 Type 2 diabetes mellitus with other specified complication: Secondary | ICD-10-CM | POA: Diagnosis not present

## 2022-12-22 DIAGNOSIS — K760 Fatty (change of) liver, not elsewhere classified: Secondary | ICD-10-CM

## 2023-01-06 ENCOUNTER — Ambulatory Visit
Admission: RE | Admit: 2023-01-06 | Discharge: 2023-01-06 | Disposition: A | Discharge status: Home / Self Care | Payer: Medicare HMO | Source: Ambulatory Visit | Attending: Gastroenterology | Admitting: Gastroenterology

## 2023-01-06 DIAGNOSIS — Z800 Family history of malignant neoplasm of digestive organs: Secondary | ICD-10-CM

## 2023-01-06 DIAGNOSIS — K760 Fatty (change of) liver, not elsewhere classified: Secondary | ICD-10-CM

## 2023-01-06 DIAGNOSIS — Z8719 Personal history of other diseases of the digestive system: Secondary | ICD-10-CM | POA: Diagnosis not present

## 2023-02-08 DIAGNOSIS — H35371 Puckering of macula, right eye: Secondary | ICD-10-CM | POA: Diagnosis not present

## 2023-02-08 DIAGNOSIS — H25813 Combined forms of age-related cataract, bilateral: Secondary | ICD-10-CM | POA: Diagnosis not present

## 2023-06-02 DIAGNOSIS — Z6824 Body mass index (BMI) 24.0-24.9, adult: Secondary | ICD-10-CM | POA: Diagnosis not present

## 2023-06-02 DIAGNOSIS — T466X5A Adverse effect of antihyperlipidemic and antiarteriosclerotic drugs, initial encounter: Secondary | ICD-10-CM | POA: Diagnosis not present

## 2023-06-02 DIAGNOSIS — E559 Vitamin D deficiency, unspecified: Secondary | ICD-10-CM | POA: Diagnosis not present

## 2023-06-02 DIAGNOSIS — E119 Type 2 diabetes mellitus without complications: Secondary | ICD-10-CM | POA: Diagnosis not present

## 2023-06-02 DIAGNOSIS — E1169 Type 2 diabetes mellitus with other specified complication: Secondary | ICD-10-CM | POA: Diagnosis not present

## 2023-06-02 DIAGNOSIS — I10 Essential (primary) hypertension: Secondary | ICD-10-CM | POA: Diagnosis not present

## 2023-06-02 DIAGNOSIS — L209 Atopic dermatitis, unspecified: Secondary | ICD-10-CM | POA: Diagnosis not present

## 2023-06-02 DIAGNOSIS — M791 Myalgia, unspecified site: Secondary | ICD-10-CM | POA: Diagnosis not present

## 2023-06-02 DIAGNOSIS — K760 Fatty (change of) liver, not elsewhere classified: Secondary | ICD-10-CM | POA: Diagnosis not present

## 2023-06-23 DIAGNOSIS — Z0000 Encounter for general adult medical examination without abnormal findings: Secondary | ICD-10-CM | POA: Diagnosis not present

## 2023-06-23 DIAGNOSIS — Z6824 Body mass index (BMI) 24.0-24.9, adult: Secondary | ICD-10-CM | POA: Diagnosis not present

## 2023-06-23 DIAGNOSIS — Z1389 Encounter for screening for other disorder: Secondary | ICD-10-CM | POA: Diagnosis not present

## 2023-07-04 DIAGNOSIS — Z1231 Encounter for screening mammogram for malignant neoplasm of breast: Secondary | ICD-10-CM | POA: Diagnosis not present

## 2024-01-09 ENCOUNTER — Telehealth: Payer: Self-pay | Admitting: Internal Medicine

## 2024-01-09 NOTE — Telephone Encounter (Signed)
 Pt's spouse states he discussed Dr Caryl Never taking his wife on as a patient. Please advise. Thanks!

## 2024-01-24 ENCOUNTER — Encounter: Payer: Self-pay | Admitting: Family Medicine

## 2024-01-24 ENCOUNTER — Ambulatory Visit (INDEPENDENT_AMBULATORY_CARE_PROVIDER_SITE_OTHER): Payer: Medicare HMO | Admitting: Family Medicine

## 2024-01-24 VITALS — BP 122/72 | HR 73 | Temp 97.8°F | Ht 58.86 in | Wt 125.3 lb

## 2024-01-24 DIAGNOSIS — R7303 Prediabetes: Secondary | ICD-10-CM | POA: Diagnosis not present

## 2024-01-24 DIAGNOSIS — Z780 Asymptomatic menopausal state: Secondary | ICD-10-CM | POA: Diagnosis not present

## 2024-01-24 DIAGNOSIS — E785 Hyperlipidemia, unspecified: Secondary | ICD-10-CM

## 2024-01-24 DIAGNOSIS — Z7689 Persons encountering health services in other specified circumstances: Secondary | ICD-10-CM

## 2024-01-24 DIAGNOSIS — K760 Fatty (change of) liver, not elsewhere classified: Secondary | ICD-10-CM | POA: Diagnosis not present

## 2024-01-24 DIAGNOSIS — R7989 Other specified abnormal findings of blood chemistry: Secondary | ICD-10-CM | POA: Diagnosis not present

## 2024-01-24 DIAGNOSIS — R739 Hyperglycemia, unspecified: Secondary | ICD-10-CM

## 2024-01-24 LAB — CBC WITH DIFFERENTIAL/PLATELET
Basophils Absolute: 0 10*3/uL (ref 0.0–0.1)
Basophils Relative: 0.7 % (ref 0.0–3.0)
Eosinophils Absolute: 0.1 10*3/uL (ref 0.0–0.7)
Eosinophils Relative: 3.1 % (ref 0.0–5.0)
HCT: 41.7 % (ref 36.0–46.0)
Hemoglobin: 13.9 g/dL (ref 12.0–15.0)
Lymphocytes Relative: 23.3 % (ref 12.0–46.0)
Lymphs Abs: 1 10*3/uL (ref 0.7–4.0)
MCHC: 33.3 g/dL (ref 30.0–36.0)
MCV: 95 fl (ref 78.0–100.0)
Monocytes Absolute: 0.5 10*3/uL (ref 0.1–1.0)
Monocytes Relative: 10.3 % (ref 3.0–12.0)
Neutro Abs: 2.8 10*3/uL (ref 1.4–7.7)
Neutrophils Relative %: 62.6 % (ref 43.0–77.0)
Platelets: 195 10*3/uL (ref 150.0–400.0)
RBC: 4.39 Mil/uL (ref 3.87–5.11)
RDW: 13 % (ref 11.5–15.5)
WBC: 4.4 10*3/uL (ref 4.0–10.5)

## 2024-01-24 LAB — COMPREHENSIVE METABOLIC PANEL
ALT: 20 U/L (ref 0–35)
AST: 20 U/L (ref 0–37)
Albumin: 4.6 g/dL (ref 3.5–5.2)
Alkaline Phosphatase: 50 U/L (ref 39–117)
BUN: 11 mg/dL (ref 6–23)
CO2: 29 meq/L (ref 19–32)
Calcium: 9.8 mg/dL (ref 8.4–10.5)
Chloride: 98 meq/L (ref 96–112)
Creatinine, Ser: 0.49 mg/dL (ref 0.40–1.20)
GFR: 93.21 mL/min (ref >60.00)
Glucose, Bld: 129 mg/dL — ABNORMAL HIGH (ref 70–99)
Potassium: 4.2 meq/L (ref 3.5–5.1)
Sodium: 133 meq/L — ABNORMAL LOW (ref 135–145)
Total Bilirubin: 0.4 mg/dL (ref 0.2–1.2)
Total Protein: 7.6 g/dL (ref 6.0–8.3)

## 2024-01-24 LAB — HEMOGLOBIN A1C: Hgb A1c MFr Bld: 6.5 % (ref 4.6–6.5)

## 2024-01-24 LAB — VITAMIN D 25 HYDROXY (VIT D DEFICIENCY, FRACTURES): VITD: 50.91 ng/mL (ref 30.00–100.00)

## 2024-01-24 LAB — LIPID PANEL
Cholesterol: 196 mg/dL (ref 0–200)
HDL: 62.6 mg/dL (ref >39.00)
LDL Cholesterol: 105 mg/dL — ABNORMAL HIGH (ref 0–99)
NonHDL: 132.91
Total CHOL/HDL Ratio: 3
Triglycerides: 140 mg/dL (ref 0.0–149.0)
VLDL: 28 mg/dL (ref 0.0–40.0)

## 2024-01-24 NOTE — Patient Instructions (Signed)
 Set up complete physical in the next few months.

## 2024-01-24 NOTE — Progress Notes (Signed)
 Established Patient Office Visit  Subjective   Patient ID: Megan Pierce, female    DOB: August 08, 1951  Age: 73 y.o. MRN: [id number]  Chief Complaint  Patient presents with   New Patient (Initial Visit)    HPI   Megan Pierce is seen to establish care.  I have seen her husband for a few years.  Her past medical history is significant for hypertension, reported history of fatty liver changes, and hyperlipidemia.  She apparently had "brain fog "on Lipitor.  She does not recall trying any other statins previously.  She does not have any history of known CAD or cerebrovascular disease.  She had mention of fatty liver changes on ultrasound from 2/24.  No alcohol use.  She currently takes valsartan 1060 mg daily for hypertension and her blood pressures by home readings have been very well-controlled.  No recent dizziness.  She has apparently had mild hyperglycemia in the past as well but A1c's have consistently been low 6 range.  She would like to get this reassessed today.  She states she has had history of low vitamin D and would like to get level rechecked.  Also requesting follow-up DEXA scan.  No known drug allergies.  Surgical history significant for previous reported total abdominal hysterectomy 2000 secondary to uterine fibroids.  Family history is somewhat sketchy.  She states her mother died age 56 of some type of liver issue.  She had a brother that had a liver and kidney transplant but she is not sure regarding etiology of his need for those.  No known family history of coronary disease.  Father died age 54 of "old age ".  Social history-she is married.  Non-smoker.  No alcohol.  She and her husband enjoy international travel.  Recent 33-month trip to [place]  Past Medical History:  Diagnosis Date   Diabetes mellitus without complication (HCC)    type 2   Hypertension    Past Surgical History:  Procedure Laterality Date   ABDOMINAL HYSTERECTOMY  2000    reports that she has never  smoked. She has never used smokeless tobacco. She reports that she does not drink alcohol and does not use drugs. family history includes Hearing loss in her mother. Not on File  Review of Systems  Constitutional:  Negative for malaise/fatigue.  Eyes:  Negative for blurred vision.  Respiratory:  Negative for shortness of breath.   Cardiovascular:  Negative for chest pain.  Neurological:  Negative for dizziness, weakness and headaches.      Objective:     BP 122/72 (BP Location: Left Arm, Patient Position: Sitting, Cuff Size: Normal)   Pulse 73   Temp 97.8 F (36.6 C) (Oral)   Ht 4' 10.86" (1.495 m)   Wt 125 lb 4.8 oz (56.8 kg)   SpO2 97%   BMI 25.43 kg/m  BP Readings from Last 3 Encounters:  01/24/24 122/72   Wt Readings from Last 3 Encounters:  01/24/24 125 lb 4.8 oz (56.8 kg)      Physical Exam Vitals reviewed.  Constitutional:      Appearance: She is well-developed.  Eyes:     Pupils: Pupils are equal, round, and reactive to light.  Neck:     Thyroid: No thyromegaly.     Vascular: No JVD.  Cardiovascular:     Rate and Rhythm: Normal rate and regular rhythm.     Heart sounds:     No gallop.  Pulmonary:     Effort: Pulmonary effort is  normal. No respiratory distress.     Breath sounds: Normal breath sounds. No wheezing or rales.  Musculoskeletal:     Cervical back: Neck supple.     Right lower leg: No edema.     Left lower leg: No edema.  Neurological:     Mental Status: She is alert.      No results found for any visits on 01/24/24.    The ASCVD Risk score (Arnett DK, et al., 2019) failed to calculate for the following reasons:   Cannot find a previous HDL lab   Cannot find a previous total cholesterol lab    Assessment & Plan:   Problem List Items Addressed This Visit       Unprioritized   Hyperlipidemia   Relevant Medications   valsartan (DIOVAN) 160 MG tablet   Other Relevant Orders   CBC with Differential/Platelet   CMP   Lipid  panel   Fatty liver   Relevant Orders   CBC with Differential/Platelet   Other Visit Diagnoses       Encounter to establish care    -  Primary     Postmenopausal       Relevant Orders   DG Bone Density     Hyperglycemia       Relevant Orders   Hemoglobin A1c     Low vitamin D level       Relevant Orders   CBC with Differential/Platelet   VITAMIN D 25 Hydroxy (Vit-D Deficiency, Fractures)     Patient is new to establish care.  She has hypertension which is currently well-controlled on valsartan  Other chronic medical problems as above.  Obtain follow-up labs.  Will address lipids once we get lab values back.  -We have encouraged her to consider setting up complete physical soon to discuss other health maintenance in more detail.  -Patient requesting follow-up DEXA scan and this will be ordered.  No follow-ups on file.    Evelena Peat, MD

## 2024-02-13 DIAGNOSIS — H35371 Puckering of macula, right eye: Secondary | ICD-10-CM | POA: Diagnosis not present

## 2024-03-23 ENCOUNTER — Other Ambulatory Visit: Payer: Self-pay | Admitting: Family Medicine

## 2024-03-23 NOTE — Telephone Encounter (Unsigned)
 Copied from CRM [id number]. Topic: Clinical - Medication Refill >> Mar 23, 2024 12:14 PM Trula Gable C wrote: Medication:  valsartan (DIOVAN) 160 MG tablet   Has the patient contacted their pharmacy? Yes (Agent: If no, request that the patient contact the pharmacy for the refill. If patient does not wish to contact the pharmacy document the reason why and proceed with request.) (Agent: If yes, when and what did the pharmacy advise?)  This is the patient's preferred pharmacy:  [id number] [place], [place] - [address] [place] [address] Phone: [phone] Fax: [phone]  Is this the correct pharmacy for this prescription? Yes If no, delete pharmacy and type the correct one.   Has the prescription been filled recently? No  Is the patient out of the medication? Yes  Has the patient been seen for an appointment in the last year OR does the patient have an upcoming appointment? Yes  Can we respond through MyChart? Yes  Agent: Please be advised that Rx refills may take up to 3 business days. We ask that you follow-up with your pharmacy.

## 2024-03-26 MED ORDER — VALSARTAN 160 MG PO TABS: 160.0000 mg | ORAL_TABLET | Freq: Every day | ORAL | 3 refills | Status: AC

## 2024-04-30 ENCOUNTER — Ambulatory Visit (INDEPENDENT_AMBULATORY_CARE_PROVIDER_SITE_OTHER): Admitting: Family Medicine

## 2024-04-30 VITALS — BP 132/78 | HR 73 | Temp 98.1°F | Wt 123.7 lb

## 2024-04-30 DIAGNOSIS — I10 Essential (primary) hypertension: Secondary | ICD-10-CM

## 2024-04-30 DIAGNOSIS — R739 Hyperglycemia, unspecified: Secondary | ICD-10-CM

## 2024-04-30 DIAGNOSIS — K760 Fatty (change of) liver, not elsewhere classified: Secondary | ICD-10-CM | POA: Diagnosis not present

## 2024-04-30 LAB — POCT GLYCOSYLATED HEMOGLOBIN (HGB A1C): Hemoglobin A1C: 6.2 % — AB (ref 4.0–5.6)

## 2024-04-30 NOTE — Patient Instructions (Signed)
 A1C today is 6.2% which is improved.

## 2024-04-30 NOTE — Progress Notes (Signed)
 Established Patient Office Visit  Subjective   Patient ID: Megan Pierce, female    DOB: 1951/11/07  Age: 73 y.o. MRN: [id number]  Chief Complaint  Patient presents with   Medical Management of Chronic Issues    HPI   Megan Pierce is here for medical follow-up.  She establish care recently in March.  Her A1c was 6.5%.  She does not take anything for blood sugars.  She has been diligent with diet and trying to avoid high glycemic foods.  Does not consistently exercise.  Does not monitor blood sugars regularly at home.  No polyuria polydipsia.  Hypertension treated with Diovan  160 mg daily.  Compliant with therapy.  She states that she has occasional blood pressures at home 140s 150s but mostly 120s and 130s systolic.  No recent headaches.  No chest pains.  Strong family history of liver disease.  Her mom died of nonalcoholic cirrhosis apparently in her 34s.  Also had a brother that had a liver and kidney transplant.  She is not sure regarding etiology.  She is not a aware of any family history of autoimmune hepatitis, Wilson's disease, alpha-1 antitrypsin deficiency, etc.  She does have some fatty liver changes based on ultrasound in the chart from February 2024.  No evidence for cirrhosis.  She had recent labs and liver transaminases were normal.  No evidence for chronic liver disease.  Past Medical History:  Diagnosis Date   Diabetes mellitus without complication (HCC)    type 2   Hypertension    Past Surgical History:  Procedure Laterality Date   ABDOMINAL HYSTERECTOMY  2000    reports that she has never smoked. She has never used smokeless tobacco. She reports that she does not drink alcohol and does not use drugs. family history includes Hearing loss in her mother. Not on File  Review of Systems  Constitutional:  Negative for malaise/fatigue.  Eyes:  Negative for blurred vision.  Respiratory:  Negative for shortness of breath.   Cardiovascular:  Negative for chest pain.   Neurological:  Negative for dizziness, weakness and headaches.      Objective:     BP 132/78 (BP Location: Left Arm, Cuff Size: Normal)   Pulse 73   Temp 98.1 F (36.7 C) (Oral)   Wt 123 lb 11.2 oz (56.1 kg)   SpO2 97%   BMI 25.11 kg/m  BP Readings from Last 3 Encounters:  04/30/24 132/78  01/24/24 122/72   Wt Readings from Last 3 Encounters:  04/30/24 123 lb 11.2 oz (56.1 kg)  01/24/24 125 lb 4.8 oz (56.8 kg)      Physical Exam Vitals reviewed.  Constitutional:      General: She is not in acute distress.    Appearance: She is well-developed. She is not ill-appearing.   Eyes:     Pupils: Pupils are equal, round, and reactive to light.   Neck:     Thyroid: No thyromegaly.     Vascular: No JVD.   Cardiovascular:     Rate and Rhythm: Normal rate and regular rhythm.     Heart sounds:     No gallop.  Pulmonary:     Effort: Pulmonary effort is normal. No respiratory distress.     Breath sounds: Normal breath sounds. No wheezing or rales.   Musculoskeletal:     Cervical back: Neck supple.   Neurological:     Mental Status: She is alert.      Results for orders placed or performed  in visit on 04/30/24  POC HgB A1c  Result Value Ref Range   Hemoglobin A1C 6.2 (A) 4.0 - 5.6 %   HbA1c POC (<> result, manual entry)     HbA1c, POC (prediabetic range)     HbA1c, POC (controlled diabetic range)      Last CBC Lab Results  Component Value Date   WBC 4.4 01/24/2024   HGB 13.9 01/24/2024   HCT 41.7 01/24/2024   MCV 95.0 01/24/2024   RDW 13.0 01/24/2024   PLT 195.0 01/24/2024   Last metabolic panel Lab Results  Component Value Date   GLUCOSE 129 (H) 01/24/2024   NA 133 (L) 01/24/2024   K 4.2 01/24/2024   CL 98 01/24/2024   CO2 29 01/24/2024   BUN 11 01/24/2024   CREATININE 0.49 01/24/2024   GFR 93.21 01/24/2024   CALCIUM 9.8 01/24/2024   PROT 7.6 01/24/2024   ALBUMIN 4.6 01/24/2024   BILITOT 0.4 01/24/2024   ALKPHOS 50 01/24/2024   AST 20  01/24/2024   ALT 20 01/24/2024   Last hemoglobin A1c Lab Results  Component Value Date   HGBA1C 6.2 (A) 04/30/2024      The 10-year ASCVD risk score (Arnett DK, et al., 2019) is: 35.1%    Assessment & Plan:   #1 history of hyperglycemia and borderline type 2 diabetes.  Currently managing without medication.  A1c today improved to 6.2%.  Continue lower glycemic diet and recommend more consistent exercise.  Recheck in 6 months  #2 hypertension.  Initial blood pressure up some today but came down with the rest.  Continue low-sodium diet.  Continue Diovan  160 mg daily.  Continue home monitoring and be in touch if consistently greater than 140 systolic  #3 fatty liver by history.  She uses no alcohol.  Continue low glycemic diet and diet high in plant-based fats and omega-3's   Return in about 6 months (around 10/30/2024).    Glean Lamy, MD

## 2024-07-04 DIAGNOSIS — Z1231 Encounter for screening mammogram for malignant neoplasm of breast: Secondary | ICD-10-CM | POA: Diagnosis not present

## 2024-08-15 ENCOUNTER — Ambulatory Visit

## 2024-08-15 VITALS — BP 120/60 | HR 71 | Temp 98.1°F | Ht <= 58 in | Wt 127.1 lb

## 2024-08-15 DIAGNOSIS — Z1211 Encounter for screening for malignant neoplasm of colon: Secondary | ICD-10-CM

## 2024-08-15 DIAGNOSIS — Z13820 Encounter for screening for osteoporosis: Secondary | ICD-10-CM | POA: Diagnosis not present

## 2024-08-15 DIAGNOSIS — Z0000 Encounter for general adult medical examination without abnormal findings: Secondary | ICD-10-CM | POA: Diagnosis not present

## 2024-08-15 NOTE — Progress Notes (Signed)
 Subjective:   Megan Pierce is a 73 y.o. who presents for a Medicare Wellness preventive visit.  As a reminder, Annual Wellness Visits don't include a physical exam, and some assessments may be limited, especially if this visit is performed virtually. We may recommend an in-person follow-up visit with your provider if needed.  Visit Complete: In person    Persons Participating in Visit: Patient.  AWV Questionnaire: No: Patient Medicare AWV questionnaire was not completed prior to this visit.  Cardiac Risk Factors include: advanced age (>35men, >2 women);hypertension     Objective:    Today's Vitals   08/15/24 1304  BP: 120/60  Pulse: 71  Temp: 98.1 F (36.7 C)  TempSrc: Oral  SpO2: 93%  Weight: 127 lb 1.6 oz (57.7 kg)  Height: 4' 10 (1.473 m)   Body mass index is 26.56 kg/m.     08/15/2024    1:17 PM  Advanced Directives  Does Patient Have a Medical Advance Directive? No  Would patient like information on creating a medical advance directive? No - Patient declined    Current Medications (verified) Outpatient Encounter Medications as of 08/15/2024  Medication Sig   cholecalciferol (VITAMIN D3) 25 MCG (1000 UNIT) tablet Take 1,000 Units by mouth daily.   co-enzyme Q-10 30 MG capsule Take 30 mg by mouth 3 (three) times daily.   glucose blood test strip 1 each by Other route as needed for other. Use as instructed   l-methylfolate-B6-B12 (METANX) 3-35-2 MG TABS tablet Take 1 tablet by mouth daily.   milk thistle 175 MG tablet Take 175 mg by mouth daily.   valsartan  (DIOVAN ) 160 MG tablet Take 1 tablet (160 mg total) by mouth daily.   No facility-administered encounter medications on file as of 08/15/2024.    Allergies (verified) Patient has no allergy information on record.   History: Past Medical History:  Diagnosis Date   Diabetes mellitus without complication (HCC)    type 2   Hypertension    Past Surgical History:  Procedure Laterality Date   ABDOMINAL  HYSTERECTOMY  2000   Family History  Problem Relation Age of Onset   Hearing loss Mother    Social History   Socioeconomic History   Marital status: Married    Spouse name: Not on file   Number of children: 2   Years of education: Not on file   Highest education level: Not on file  Occupational History   Not on file  Tobacco Use   Smoking status: Never   Smokeless tobacco: Never  Vaping Use   Vaping status: Not on file  Substance and Sexual Activity   Alcohol use: Never   Drug use: Never   Sexual activity: Yes  Other Topics Concern   Not on file  Social History Narrative   Not on file   Social Drivers of Health   Financial Resource Strain: Low Risk  (08/15/2024)   Overall Financial Resource Strain (CARDIA)    Difficulty of Paying Living Expenses: Not hard at all  Food Insecurity: No Food Insecurity (08/15/2024)   Hunger Vital Sign    Worried About Running Out of Food in the Last Year: Never true    Ran Out of Food in the Last Year: Never true  Transportation Needs: No Transportation Needs (08/15/2024)   PRAPARE - Administrator, Civil Service (Medical): No    Lack of Transportation (Non-Medical): No  Physical Activity: Insufficiently Active (08/15/2024)   Exercise Vital Sign  Days of Exercise per Week: 1 day    Minutes of Exercise per Session: 40 min  Stress: No Stress Concern Present (08/15/2024)   [employer] of Occupational Health - Occupational Stress Questionnaire    Feeling of Stress: Not at all  Social Connections: Moderately Isolated (08/15/2024)   Social Connection and Isolation Panel    Frequency of Communication with Friends and Family: More than three times a week    Frequency of Social Gatherings with Friends and Family: More than three times a week    Attends Religious Services: Never    Database administrator or Organizations: No    Attends Engineer, structural: Never    Marital Status: Married    Tobacco  Counseling Counseling given: Not Answered    Clinical Intake:  Pre-visit preparation completed: Yes  Pain : No/denies pain     BMI - recorded: 26.56 Nutritional Status: BMI 25 -29 Overweight Nutritional Risks: None Diabetes: No  Lab Results  Component Value Date   HGBA1C 6.2 (A) 04/30/2024   HGBA1C 6.5 01/24/2024     How often do you need to have someone help you when you read instructions, pamphlets, or other written materials from your doctor or pharmacy?: 1 - Never  Interpreter Needed?: No  Information entered by :: Rojelio Blush LPN   Activities of Daily Living     08/15/2024    1:16 PM  In your present state of health, do you have any difficulty performing the following activities:  Hearing? 0  Vision? 0  Difficulty concentrating or making decisions? 0  Walking or climbing stairs? 0  Dressing or bathing? 0  Doing errands, shopping? 0  Preparing Food and eating ? N  Using the Toilet? N  In the past six months, have you accidently leaked urine? N  Do you have problems with loss of bowel control? N  Managing your Medications? N  Managing your Finances? N  Housekeeping or managing your Housekeeping? N    Patient Care Team: Micheal Wolm ORN, MD as PCP - General (Family Medicine)  I have updated your Care Teams any recent Medical Services you may have received from other providers in the past year.     Assessment:   This is a routine wellness examination for Megan Pierce.  Hearing/Vision screen Hearing Screening - Comments:: Denies hearing difficulties   Vision Screening - Comments:: Wears rx glasses - up to date with routine eye exams with  [facility]   Goals Addressed               This Visit's Progress     Increase physical activity (pt-stated)        Get more active.       Depression Screen     08/15/2024    1:12 PM 01/24/2024   11:08 AM  PHQ 2/9 Scores  PHQ - 2 Score 0 0  PHQ- 9 Score  0    Fall Risk     08/15/2024    1:17  PM 01/24/2024   11:07 AM  Fall Risk   Falls in the past year? 0 0  Number falls in past yr: 0 0  Injury with Fall? 0 0  Risk for fall due to : No Fall Risks No Fall Risks  Follow up Falls evaluation completed Falls evaluation completed    MEDICARE RISK AT HOME:  Medicare Risk at Home Any stairs in or around the home?: Yes If so, are there any without handrails?:  No Home free of loose throw rugs in walkways, pet beds, electrical cords, etc?: Yes Adequate lighting in your home to reduce risk of falls?: Yes Life alert?: No Use of a cane, walker or w/c?: No Grab bars in the bathroom?: No Shower chair or bench in shower?: No Elevated toilet seat or a handicapped toilet?: No  TIMED UP AND GO:  Was the test performed?  Yes  Length of time to ambulate 10 feet: 10 sec Gait steady and fast without use of assistive device  Cognitive Function: 6CIT completed        08/15/2024    1:17 PM  6CIT Screen  What Year? 0 points  What month? 0 points  What time? 0 points  Count back from 20 0 points  Months in reverse 0 points  Repeat phrase 0 points  Total Score 0 points    Immunizations Immunization History  Administered Date(s) Administered   PFIZER(Purple Top)SARS-COV-2 Vaccination 01/10/2020, 02/05/2020   Pfizer Covid-19 Vaccine Bivalent Booster 60yrs & up 08/01/2021    Screening Tests Health Maintenance  Topic Date Due   Hepatitis C Screening  Never done   DTaP/Tdap/Td (1 - Tdap) Never done   Pneumococcal Vaccine: 50+ Years (1 of 2 - PCV) Never done   Colonoscopy  Never done   Zoster Vaccines- Shingrix (1 of 2) Never done   DEXA SCAN  Never done   Influenza Vaccine  06/15/2024   COVID-19 Vaccine (4 - 2025-26 season) 07/16/2024   Medicare Annual Wellness (AWV)  08/15/2025   Mammogram  06/15/2026   HPV VACCINES  Aged Out   Meningococcal B Vaccine  Aged Out    Health Maintenance Items Addressed: DEXA ordered, Referral sent to GI for colonoscopy  Additional  Screening:  Vision Screening: Recommended annual ophthalmology exams for early detection of glaucoma and other disorders of the eye. Is the patient up to date with their annual eye exam?  Yes  Who is the provider or what is the name of the office in which the patient attends annual eye exams? [facility]  Dental Screening: Recommended annual dental exams for proper oral hygiene  Community Resource Referral / Chronic Care Management: CRR required this visit?  No   CCM required this visit?  No   Plan:    I have personally reviewed and noted the following in the patient's chart:   Medical and social history Use of alcohol, tobacco or illicit drugs  Current medications and supplements including opioid prescriptions. Patient is not currently taking opioid prescriptions. Functional ability and status Nutritional status Physical activity Advanced directives List of other physicians Hospitalizations, surgeries, and ER visits in previous 12 months Vitals Screenings to include cognitive, depression, and falls Referrals and appointments  In addition, I have reviewed and discussed with patient certain preventive protocols, quality metrics, and best practice recommendations. A written personalized care plan for preventive services as well as general preventive health recommendations were provided to patient.   Rojelio LELON Blush, LPN   [id number]/06/[id number]   After Visit Summary: (In Person-Printed) AVS printed and given to the patient  Notes: Nothing significant to report at this time.

## 2024-08-15 NOTE — Patient Instructions (Addendum)
 [name],  Thank you for taking the time for your Medicare Wellness Visit. I appreciate your continued commitment to your health goals. Please review the care plan we discussed, and feel free to reach out if I can assist you further.  Medicare recommends these wellness visits once per year to help you and your care team stay ahead of potential health issues. These visits are designed to focus on prevention, allowing your provider to concentrate on managing your acute and chronic conditions during your regular appointments.  Please note that Annual Wellness Visits do not include a physical exam. Some assessments may be limited, especially if the visit was conducted virtually. If needed, we may recommend a separate in-person follow-up with your provider.  Ongoing Care Seeing your primary care provider every 3 to 6 months helps us  monitor your health and provide consistent, personalized care.   Referrals If a referral was made during today's visit and you haven't received any updates within two weeks, please contact the referred provider directly to check on the status.  Recommended Screenings:  Health Maintenance  Topic Date Due   Hepatitis C Screening  Never done   DTaP/Tdap/Td vaccine (1 - Tdap) Never done   Pneumococcal Vaccine for age over 40 (1 of 2 - PCV) Never done   Colon Cancer Screening  Never done   Zoster (Shingles) Vaccine (1 of 2) Never done   DEXA scan (bone density measurement)  Never done   Flu Shot  06/15/2024   COVID-19 Vaccine (4 - 2025-26 season) 07/16/2024   Medicare Annual Wellness Visit  08/15/2025   Breast Cancer Screening  06/15/2026   HPV Vaccine  Aged Out   Meningitis B Vaccine  Aged Out       08/15/2024    1:17 PM  Advanced Directives  Does Patient Have a Medical Advance Directive? No  Would patient like information on creating a medical advance directive? No - Patient declined   Advance Care Planning is important because it: Ensures you receive  medical care that aligns with your values, goals, and preferences. Provides guidance to your family and loved ones, reducing the emotional burden of decision-making during critical moments.  Vision: Annual vision screenings are recommended for early detection of glaucoma, cataracts, and diabetic retinopathy. These exams can also reveal signs of chronic conditions such as diabetes and high blood pressure.  Dental: Annual dental screenings help detect early signs of oral cancer, gum disease, and other conditions linked to overall health, including heart disease and diabetes.  Please see the attached documents for additional preventive care recommendations.

## 2025-08-21 ENCOUNTER — Ambulatory Visit
